# Patient Record
Sex: Male | Born: 1985 | Race: White | Hispanic: No | Marital: Single
Health system: Southern US, Community
[De-identification: ages and names within clinical notes are randomized; demographics above are authoritative.]

---

## 2004-11-12 ENCOUNTER — Emergency Department: Payer: Self-pay | Admitting: Emergency Medicine

## 2006-08-29 ENCOUNTER — Emergency Department: Payer: Self-pay | Admitting: Internal Medicine

## 2008-08-23 ENCOUNTER — Emergency Department: Payer: Self-pay | Admitting: Emergency Medicine

## 2008-09-05 ENCOUNTER — Ambulatory Visit: Payer: Self-pay | Admitting: Orthopedic Surgery

## 2008-10-06 ENCOUNTER — Emergency Department: Payer: Self-pay | Admitting: Emergency Medicine

## 2008-10-08 ENCOUNTER — Ambulatory Visit: Payer: Self-pay | Admitting: Orthopedic Surgery

## 2013-02-11 ENCOUNTER — Emergency Department: Payer: Self-pay | Admitting: Emergency Medicine
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
## 2021-05-04 ENCOUNTER — Emergency Department
Admission: EM | Admit: 2021-05-04 | Discharge: 2021-05-04 | Disposition: A | Discharge status: Home / Self Care | Payer: BC Managed Care – PPO | Attending: Emergency Medicine | Admitting: Emergency Medicine

## 2021-05-04 ENCOUNTER — Other Ambulatory Visit: Payer: Self-pay

## 2021-05-04 DIAGNOSIS — K625 Hemorrhage of anus and rectum: Secondary | ICD-10-CM | POA: Insufficient documentation

## 2021-05-04 LAB — COMPREHENSIVE METABOLIC PANEL
ALT: 35 U/L (ref 0–44)
AST: 22 U/L (ref 15–41)
Albumin: 4.4 g/dL (ref 3.5–5.0)
Alkaline Phosphatase: 79 U/L (ref 38–126)
Anion gap: 8 (ref 5–15)
BUN: 13 mg/dL (ref 6–20)
CO2: 26 mmol/L (ref 22–32)
Calcium: 10 mg/dL (ref 8.9–10.3)
Chloride: 104 mmol/L (ref 98–111)
Creatinine, Ser: 1.01 mg/dL (ref 0.61–1.24)
GFR, Estimated: >60 mL/min (ref >60)
Glucose, Bld: 88 mg/dL (ref 70–99)
Potassium: 3.9 mmol/L (ref 3.5–5.1)
Sodium: 138 mmol/L (ref 135–145)
Total Bilirubin: 0.8 mg/dL (ref 0.3–1.2)
Total Protein: 7.2 g/dL (ref 6.5–8.1)

## 2021-05-04 LAB — CBC
HCT: 47.1 % (ref 39.0–52.0)
Hemoglobin: 16.7 g/dL (ref 13.0–17.0)
MCH: 33.2 pg (ref 26.0–34.0)
MCHC: 35.5 g/dL (ref 30.0–36.0)
MCV: 93.6 fL (ref 80.0–100.0)
Platelets: 380 10*3/uL (ref 150–400)
RBC: 5.03 MIL/uL (ref 4.22–5.81)
RDW: 12.4 % (ref 11.5–15.5)
WBC: 11.6 10*3/uL — ABNORMAL HIGH (ref 4.0–10.5)
nRBC: 0 % (ref 0.0–0.2)

## 2021-05-04 LAB — TYPE AND SCREEN
ABO/RH(D): B POS
Antibody Screen: NEGATIVE

## 2021-05-04 NOTE — ED Provider Notes (Signed)
[facility] Emergency Department Provider Note ____________________________________________   Event Date/Time   First MD Initiated Contact with Patient 05/04/21 1641     (approximate)  I have reviewed the triage vital signs and the nursing notes.  HISTORY  Chief Complaint Rectal Bleeding   HPI Evan Walters is a 35 y.o. Bonnita Nasuti presents to the ED for evaluation of rectal bleeding.  Chart review indicates no relevant history, no thinners. Patient reports drinking 1-3 alcoholic beverages per day.  He reports drinking a little more heavily yesterday having 5 or 6 alcoholic beverages.  Denies additional recreational drug use.  Denies history of hemorrhoids, rectal bleeding.   He reports 2 episodes of hematochezia this morning.  He reports producing a solid brown stool with bright red blood on top of this.  He reports a second bowel movement later today with more thin liquid output, which he says is sometimes typical after his drinking, but the toilet water was pink in color.  He denies any abdominal pain, rectal or anal pain.  Denies any hematuria, coffee-ground emesis or melena.  Reports he feels fine now, but is scared because he knew someone who had rectal bleeding at a music festival, and then died the next day.  No past medical history on file.  There are no problems to display for this patient.    Prior to Admission medications   Not on File    Allergies Patient has no allergy information on record.  No family history on file.  Social History    Review of Systems  Constitutional: No fever/chills Eyes: No visual changes. ENT: No sore throat. Cardiovascular: Denies chest pain. Respiratory: Denies shortness of breath. Gastrointestinal: No abdominal pain.  No nausea, no vomiting.  No diarrhea.  No constipation.  Positive for rectal bleeding. Genitourinary: Negative for dysuria. Musculoskeletal: Negative for back pain. Skin: Negative for  rash. Neurological: Negative for headaches, focal weakness or numbness.   ____________________________________________   PHYSICAL EXAM:  VITAL SIGNS: Vitals:   05/04/21 1554 05/04/21 1705  BP: (!) 140/93 (!) 144/96  Pulse: 74 79  Resp: 18 15  Temp: 97.9 F (36.6 C)   SpO2: 98% 100%     Constitutional: Alert and oriented. Well appearing and in no acute distress. Eyes: Conjunctivae are normal. PERRL. EOMI. Head: Atraumatic. Nose: No congestion/rhinnorhea. Mouth/Throat: Mucous membranes are moist.  Oropharynx non-erythematous. Neck: No stridor. No cervical spine tenderness to palpation. Cardiovascular: Normal rate, regular rhythm. Grossly normal heart sounds.  Good peripheral circulation. Respiratory: Normal respiratory effort.  No retractions. Lungs CTAB. Gastrointestinal: Soft , nondistended, nontender to palpation. No CVA tenderness.  Benign abdomen. Anorectal: Exam chaperoned by bedside RN.  Normal-appearing external anatomy without external hemorrhoids or anal fissures.  Lubricated DRE is well-tolerated with small-volume mixed red blood and brown stool.  No palpable masses or pathology noted. Musculoskeletal: No lower extremity tenderness nor edema.  No joint effusions. No signs of acute trauma. Neurologic:  Normal speech and language. No gross focal neurologic deficits are appreciated. No gait instability noted. Skin:  Skin is warm, dry and intact. No rash noted. Psychiatric: Mood and affect are normal. Speech and behavior are normal.  ____________________________________________   LABS (all labs ordered are listed, but only abnormal results are displayed)  Labs Reviewed  CBC - Abnormal; Notable for the following components:      Result Value   WBC 11.6 (*)    All other components within normal limits  COMPREHENSIVE METABOLIC PANEL  POC OCCULT  BLOOD, ED  TYPE AND SCREEN   ____________________________________________  12 Lead  EKG   ____________________________________________  RADIOLOGY  ED MD interpretation:    Official radiology report(s): No results found.  ____________________________________________   PROCEDURES and INTERVENTIONS  Procedure(s) performed (including Critical Care):  Procedures  Medications - No data to display  ____________________________________________   MDM / ED COURSE   Otherwise healthy 35 year old male presents to the ED after an episode of rectal bleeding hematochezia, amenable to outpatient management with GI follow-up.  He looks well with a benign examination externally.  Benign abdomen.  Blood work with stable hemoglobin without evidence of anemia.  No signs of anal fissures or external hemorrhoids.  We will discharge with return precautions and referral to GI     ____________________________________________   FINAL CLINICAL IMPRESSION(S) / ED DIAGNOSES  Final diagnoses:  Rectal bleeding     ED Discharge Orders     None        Osinachi Navarrette   Note:  This document was prepared using Dragon voice recognition software and may include unintentional dictation errors.    Delton [place]irie, MD 05/04/21 2322

## 2021-05-04 NOTE — ED Notes (Signed)
Assisted Dr. Katrinka Blazing with rectal exam at this time.

## 2021-05-04 NOTE — ED Triage Notes (Signed)
Pt to ED via POV. Pt seen at [facility] clinic for bloody stool and referred to come to ED. Pt stating noticed bright red blood in stool that started this morning. Pt stating noticed clots as well. Pt denies SOB, CP, abdominal pain. Pt denies blood thinners.
